# Patient Record
Sex: Female | Born: 1980 | Race: White | Hispanic: No | Marital: Married | Smoking: Former smoker
Health system: Southern US, Community
[De-identification: ages and names within clinical notes are randomized; demographics above are authoritative.]

## PROBLEM LIST (undated history)

## (undated) DIAGNOSIS — T7840XA Allergy, unspecified, initial encounter: Secondary | ICD-10-CM

## (undated) HISTORY — DX: Allergy, unspecified, initial encounter: T78.40XA

---

## 2004-02-27 ENCOUNTER — Emergency Department (HOSPITAL_COMMUNITY): Admission: EM | Admit: 2004-02-27 | Discharge: 2004-02-27 | Payer: Self-pay | Admitting: Emergency Medicine

## 2006-06-10 HISTORY — PX: CHOLECYSTECTOMY: SHX55

## 2008-02-19 ENCOUNTER — Emergency Department (HOSPITAL_COMMUNITY): Admission: EM | Admit: 2008-02-19 | Discharge: 2008-02-20 | Payer: Self-pay | Admitting: Emergency Medicine

## 2008-06-09 HISTORY — PX: LAPAROSCOPIC OVARIAN CYSTECTOMY: SUR786

## 2009-01-09 HISTORY — PX: PILONIDAL CYST EXCISION: SHX744

## 2010-01-29 IMAGING — CT CT PELVIS W/ CM
2 of 5 series · 17 of 46 positions shown, 19 images · IV contrast (agent unspecified)
Comparison: None

CT ABDOMEN

CLINICAL DATA: Right lower quadrant pain.

CT ABDOMEN AND PELVIS WITH CONTRAST
TECHNIQUE: Multidetector CT imaging of the abdomen and pelvis was
performed using the standard protocol following bolus
administration of intravenous contrast.
Contrast: 100 ml [id number]

[Series 2: abd_pel 5.0 b40f st · axial · 0.64mm/px · z∈[+990,+1350]mm · 14 of 80 slices shown, 16 images]
[im 4/80  soft-tissue]
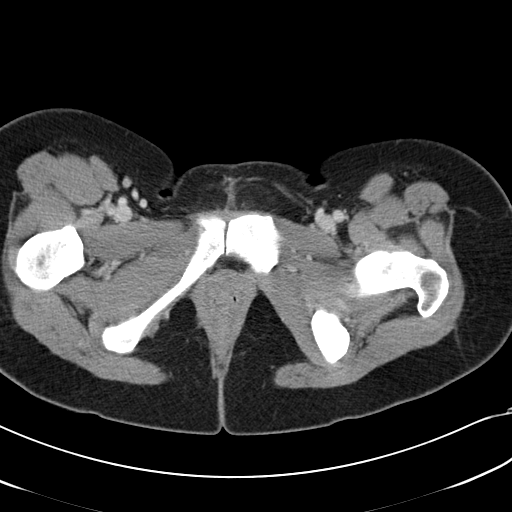
[im 4/80  bone]
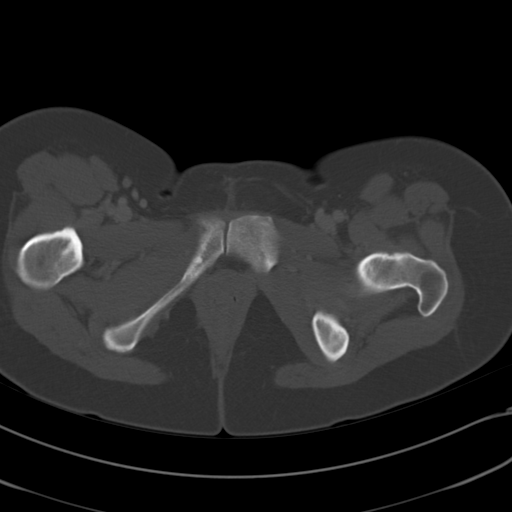
[im 12/80  soft-tissue]
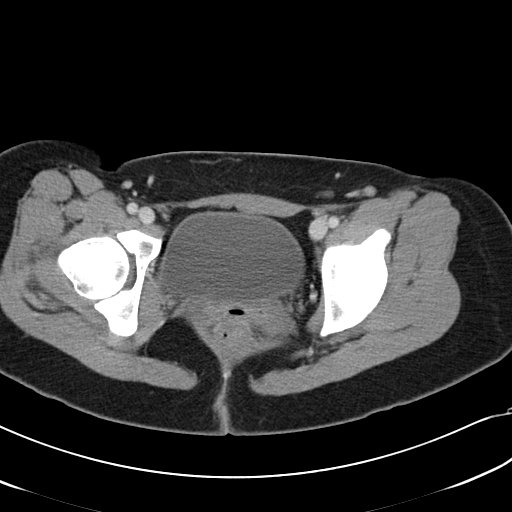
[im 16/80  soft-tissue]
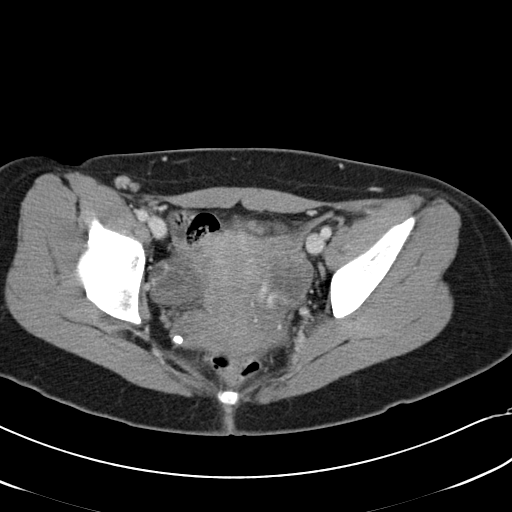
[im 20/80  soft-tissue]
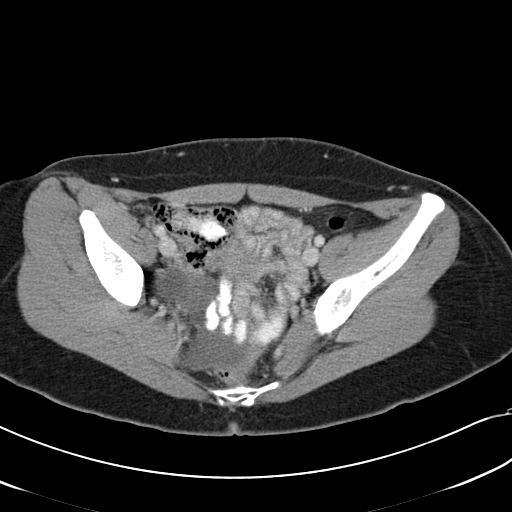
[im 28/80  soft-tissue]
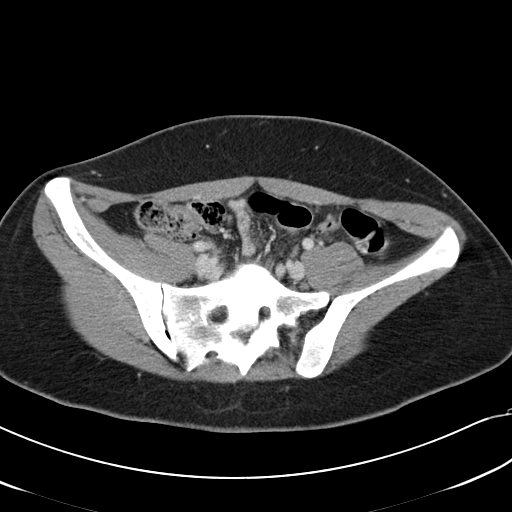
[im 32/80  soft-tissue]
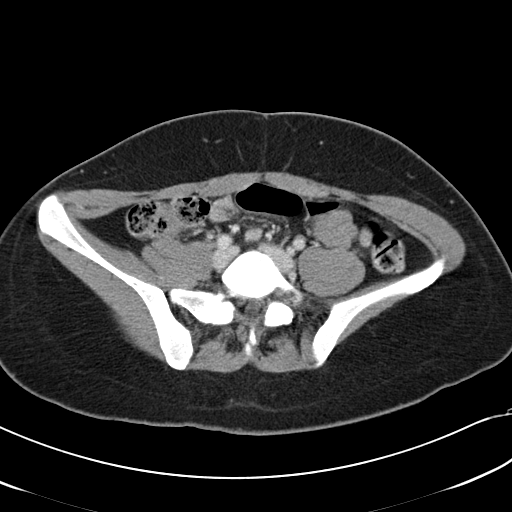
[im 36/80  soft-tissue]
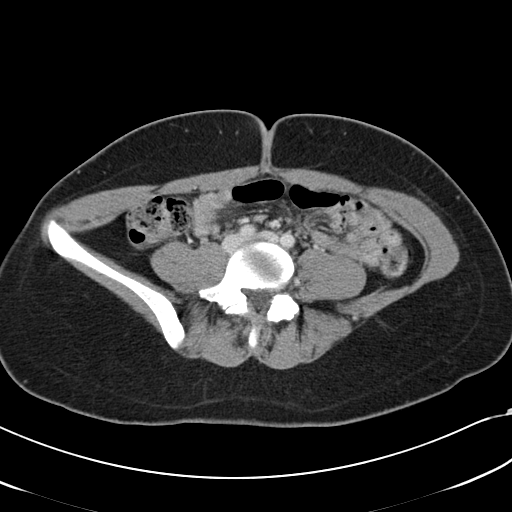
[im 44/80  soft-tissue]
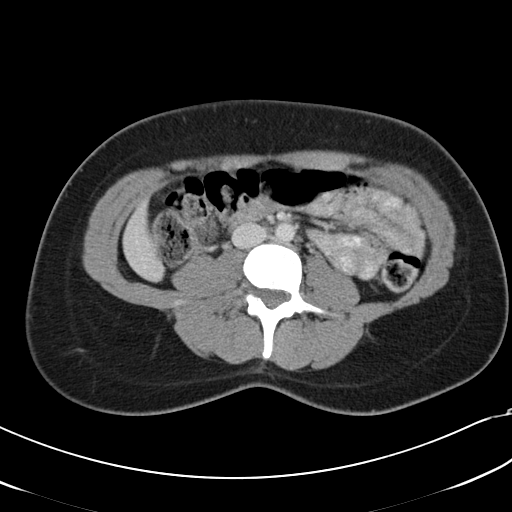
[im 48/80  soft-tissue]
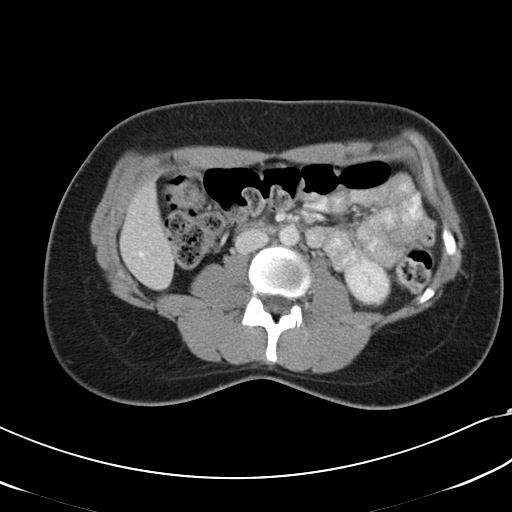
[im 48/80  bone]
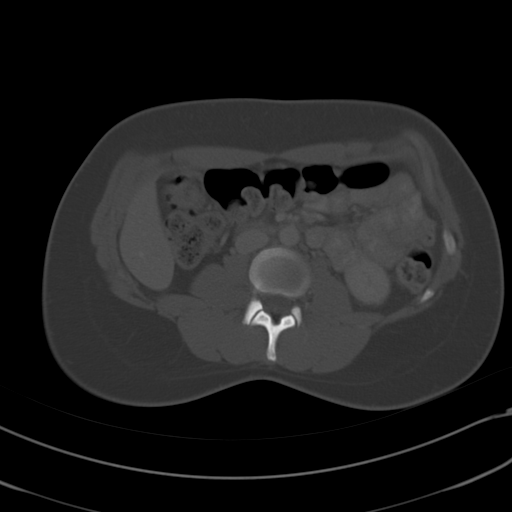
[im 52/80  soft-tissue]
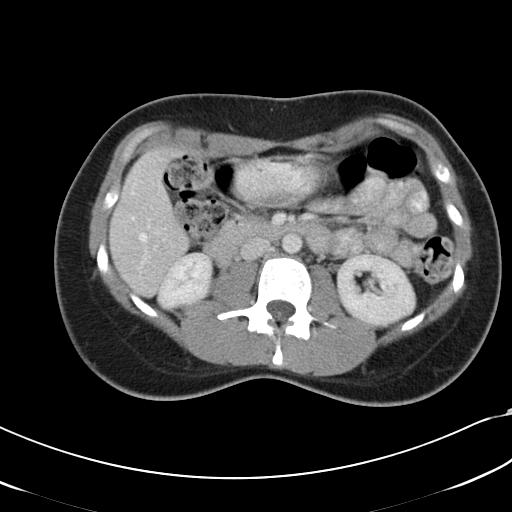
[im 60/80  soft-tissue]
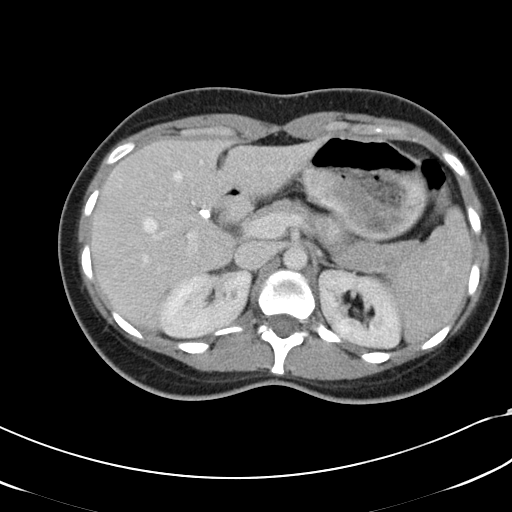
[im 64/80  soft-tissue]
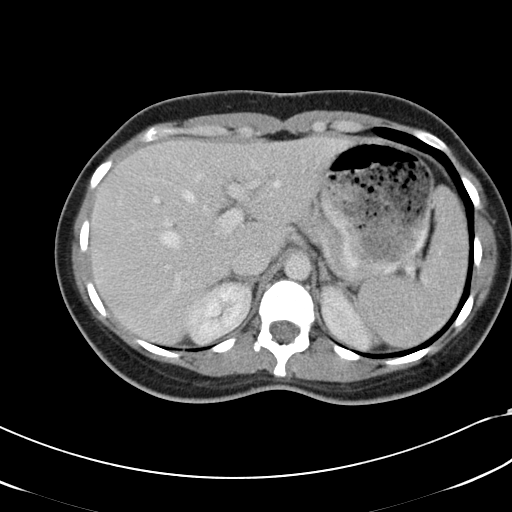
[im 68/80  soft-tissue]
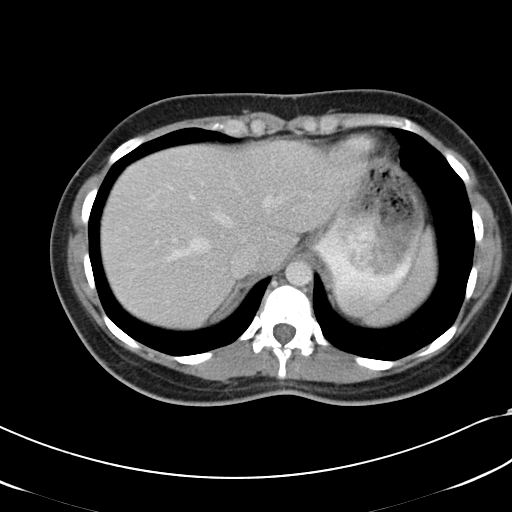
[im 76/80  soft-tissue]
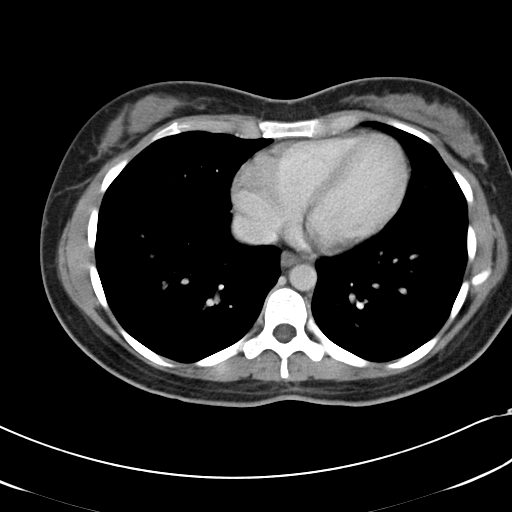

[Series 602: coronal · coronal · 0.81mm/px · 3 of 61 slices shown]
[im 21/61  soft-tissue]
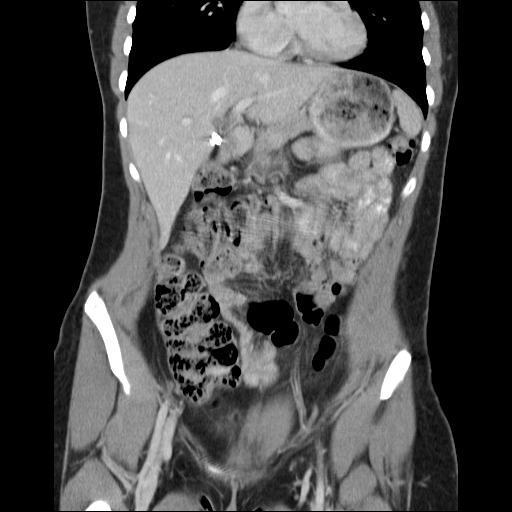
[im 27/61  soft-tissue]
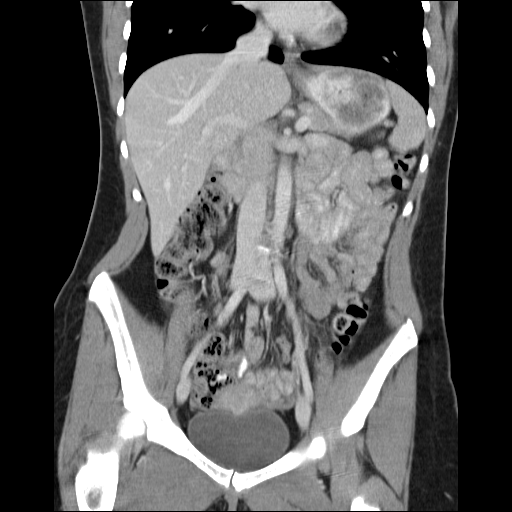
[im 34/61  soft-tissue]
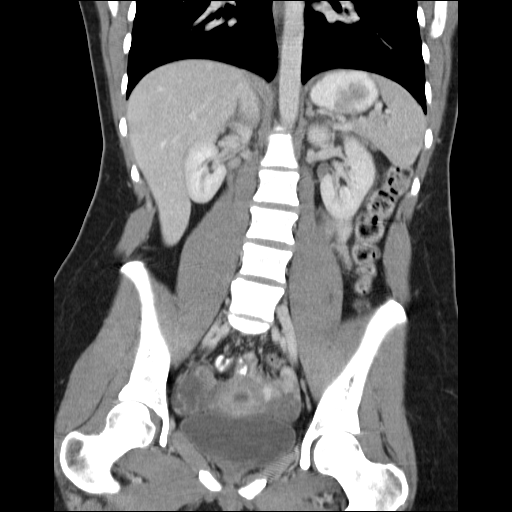

[17 of 46 positions shown; findings below may reference images not displayed]

FINDINGS: The patient is status post cholecystectomy.  Liver,
spleen, pancreas, adrenals, kidneys unremarkable. Bowel grossly
unremarkable.  No free fluid, free air, or adenopathy. Aorta is
normal caliber

Lung bases are clear.  No effusions.  Heart is normal size.
IMPRESSION: No acute findings in the abdomen.

CT PELVIS
FINDINGS: Appendix is visualized and is normal.  There are
bilateral ovarian cystic masses, right worse than left.  Small
amount of free fluid in the pelvis.  In the right ovary, there is
internal soft tissue within the cystic lesion.  Recommend
correlation with quantitative beta HCG to completely exclude
ectopic pregnancy.  Alternatively, this could represent a tubo-
ovarian abscess or a complex hemorrhagic cyst.  Uterus
unremarkable.  Small bowel unremarkable.

No acute bony abnormality.
IMPRESSION: Complex cystic areas in the ovaries bilaterally, right worse than
left.  Within the right ovarian cystic mass, soft tissue is
present.  Recommend correlation with quantitative beta HCG to
exclude ectopic pregnancy.  This could also represent tubo-ovarian
abscess or hemorrhagic cyst.

Small amount of free fluid in the pelvis.

Appendix normal.

These results were discussed with Dr. Sogso at the time of
interpretation.

## 2010-05-26 LAB — URINALYSIS, ROUTINE W REFLEX MICROSCOPIC
Bilirubin Urine: NEGATIVE
Glucose, UA: NEGATIVE mg/dL
Ketones, ur: NEGATIVE mg/dL
Protein, ur: NEGATIVE mg/dL
pH: 7.5 (ref 5.0–8.0)

## 2010-05-26 LAB — DIFFERENTIAL
Basophils Relative: 0 % (ref 0–1)
Eosinophils Absolute: 0.2 10*3/uL (ref 0.0–0.7)
Eosinophils Relative: 2 % (ref 0–5)
Monocytes Relative: 6 % (ref 3–12)
Neutrophils Relative %: 75 % (ref 43–77)

## 2010-05-26 LAB — CBC
Hemoglobin: 14.2 g/dL (ref 12.0–15.0)
RBC: 4.7 MIL/uL (ref 3.87–5.11)
WBC: 10 10*3/uL (ref 4.0–10.5)

## 2010-05-26 LAB — COMPREHENSIVE METABOLIC PANEL
ALT: 15 U/L (ref 0–35)
AST: 21 U/L (ref 0–37)
Alkaline Phosphatase: 51 U/L (ref 39–117)
CO2: 26 mEq/L (ref 19–32)
GFR calc Af Amer: >60 mL/min (ref >60)
GFR calc non Af Amer: >60 mL/min (ref >60)
Glucose, Bld: 109 mg/dL — ABNORMAL HIGH (ref 70–99)
Potassium: 3.8 mEq/L (ref 3.5–5.1)
Sodium: 137 mEq/L (ref 135–145)

## 2010-05-26 LAB — PREGNANCY, URINE: Preg Test, Ur: NEGATIVE

## 2011-09-29 ENCOUNTER — Ambulatory Visit (INDEPENDENT_AMBULATORY_CARE_PROVIDER_SITE_OTHER): Payer: BC Managed Care – PPO | Admitting: Physician Assistant

## 2011-09-29 VITALS — BP 110/80 | HR 116 | Temp 98.4°F | Resp 16 | Ht 60.5 in | Wt 146.0 lb

## 2011-09-29 DIAGNOSIS — R82998 Other abnormal findings in urine: Secondary | ICD-10-CM

## 2011-09-29 DIAGNOSIS — R110 Nausea: Secondary | ICD-10-CM

## 2011-09-29 DIAGNOSIS — R8271 Bacteriuria: Secondary | ICD-10-CM

## 2011-09-29 DIAGNOSIS — R509 Fever, unspecified: Secondary | ICD-10-CM

## 2011-09-29 LAB — POCT URINALYSIS DIPSTICK
Glucose, UA: NEGATIVE
Protein, UA: 30
Spec Grav, UA: 1.025

## 2011-09-29 LAB — POCT UA - MICROSCOPIC ONLY: Crystals, Ur, HPF, POC: NEGATIVE

## 2011-09-29 LAB — POCT CBC
Granulocyte percent: 64.8 %G (ref 37–80)
MCV: 85.6 fL (ref 80–97)
MID (cbc): 0.4 (ref 0–0.9)
MPV: 7.6 fL (ref 0–99.8)
POC Granulocyte: 3.8 (ref 2–6.9)
POC LYMPH PERCENT: 29.1 %L (ref 10–50)
POC MID %: 6.1 %M (ref 0–12)
Platelet Count, POC: 265 10*3/uL (ref 142–424)
RBC: 5.62 M/uL — AB (ref 4.04–5.48)
RDW, POC: 14.8 %

## 2011-09-29 NOTE — Patient Instructions (Signed)
Get plenty of rest (wink, wink, mom of a toddler) and drink at least 64 ounces of water daily.

## 2011-09-29 NOTE — Progress Notes (Signed)
Subjective:    Patient ID: Sharon Graves, female    DOB: 11/08/1980, 31 y.o.   MRN: [id number]  HPIThis 31 y.o. female presents for evaluation of fever since 09/27/2011.  Awoke with low back pain (typical for her with fever) and fever of 100.  Relieved temporarily with acetaminophen.  Has developed some GI upset with nausea.  No diarrhea "yet."  Wasn't really feeling bad until today.  Poor appetite.  No known tick exposure. No urinary symptoms, no URI-type symptoms.  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    Review of Systems About 10 days ago developed a tender lump behind her ear after wearing earrings for prolonged period.  Is steadily resolving.  Had a brief episode of heart palpitations last week, prior to the onset of her current symptoms.  No associated chest pain, SOB, dizziness.    History reviewed. No pertinent past medical history.  Past Surgical History  Procedure Date  . Laparoscopic ovarian cystectomy 06/2008    right  . Pilonidal cyst excision 2010  . Cholecystectomy 06/2006    Prior to Admission medications   Not on File    Allergies  Allergen Reactions  . Biaxin (Clarithromycin) Shortness Of Breath  . Ciprofloxacin     Unknown reaction  . Demerol (Meperidine) Nausea Only  . Ibuprofen     Gi bleed   . Toradol (Ketorolac Tromethamine) Itching    History   Social History  . Marital Status: Married    Spouse Name: Yeira Gulden    Number of Children: 1  . Years of Education: 15   Occupational History  . Infusion Nurse (RN)     [employer]   Social History Main Topics  . Smoking status: Former Smoker -- 1.0 packs/day for 8 years    Quit date: 11/03/2008  . Smokeless tobacco: Never Used  . Alcohol Use: No  . Drug Use: No  . Sexually Active: Yes -- Female partner(s)    Birth Control/ Protection: None     "Let nature take it's course."   Other Topics Concern  . Not on file   Social  History Narrative   Interested in having 3 more children.  Both sister's have had miscarriages, so she's suspecting she may have trouble in the future.    Family History  Problem Relation Age of Onset  . Hypertension Mother   . Heart disease Father 5    continues to smoke  . COPD Father   . Ovarian cysts Sister   . Ovarian cysts Sister        Objective:   Physical Exam  Vitals reviewed. Constitutional: She is oriented to person, place, and time. She appears well-developed and well-nourished. No distress.  HENT:  Head: Normocephalic and atraumatic.  Right Ear: Hearing, tympanic membrane, external ear and ear canal normal.  Left Ear: Hearing, tympanic membrane, external ear and ear canal normal.  Nose: Nose normal.  Mouth/Throat: Uvula is midline, oropharynx is clear and moist and mucous membranes are normal. No oral lesions. No uvula swelling.  Eyes: Conjunctivae, EOM and lids are normal. Pupils are equal, round, and reactive to light. Right eye exhibits no discharge. Left eye exhibits no discharge. No scleral icterus.  Neck: Normal range of motion, full passive range of motion without pain and phonation normal. Neck supple. No spinous process tenderness and no muscular tenderness present. No mass and no thyromegaly present.  Cardiovascular: Regular rhythm, normal heart  sounds and intact distal pulses.  Tachycardia present.   Pulmonary/Chest: Effort normal and breath sounds normal.  Abdominal: Soft. Bowel sounds are normal. She exhibits no mass. There is no hepatosplenomegaly. There is tenderness (epigastrum, suprapubic, bilateral lower quadrants. Palpation caused increased nausea and gagging.) in the right lower quadrant, epigastric area, suprapubic area and left lower quadrant. There is no rigidity, no rebound, no guarding, no CVA tenderness, no tenderness at McBurney's point and negative Murphy's sign. No hernia.  Musculoskeletal: She exhibits no edema and no tenderness.        Cervical back: Normal.       Thoracic back: Normal.       Lumbar back: Normal.  Lymphadenopathy:    She has no cervical adenopathy.       Right: No inguinal, no supraclavicular and no epitrochlear adenopathy present.       Left: No inguinal, no supraclavicular and no epitrochlear adenopathy present.  Neurological: She is alert and oriented to person, place, and time. She has normal strength. No cranial nerve deficit or sensory deficit.  Skin: Skin is warm, dry and intact. No rash noted. No cyanosis. Nails show no clubbing.  Psychiatric: She has a normal mood and affect. Her speech is normal and behavior is normal. Judgment and thought content normal. Cognition and memory are normal.   Results for orders placed in visit on 09/29/11  POCT CBC      Component Value Range   WBC 5.9  4.6 - 10.2 K/uL   Lymph, poc 1.7  0.6 - 3.4   POC LYMPH PERCENT 29.1  10 - 50 %L   MID (cbc) 0.4  0 - 0.9   POC MID % 6.1  0 - 12 %M   POC Granulocyte 3.8  2 - 6.9   Granulocyte percent 64.8  37 - 80 %G   RBC 5.62 (*) 4.04 - 5.48 M/uL   Hemoglobin 15.4  12.2 - 16.2 g/dL   HCT, POC 16.1 (*) 09.6 - 47.9 %   MCV 85.6  80 - 97 fL   MCH, POC 27.4  27 - 31.2 pg   MCHC 32.0  31.8 - 35.4 g/dL   RDW, POC 04.5     Platelet Count, POC 265  142 - 424 K/uL   MPV 7.6  0 - 99.8 fL  POCT UA - MICROSCOPIC ONLY      Component Value Range   WBC, Ur, HPF, POC 18-28     RBC, urine, microscopic 2-3     Bacteria, U Microscopic 2+     Mucus, UA positive     Epithelial cells, urine per micros 6-10     Crystals, Ur, HPF, POC negative     Casts, Ur, LPF, POC negative     Yeast, UA negative    POCT URINALYSIS DIPSTICK      Component Value Range   Color, UA dark yellow     Clarity, UA cloudy     Glucose, UA negative     Bilirubin, UA small     Ketones, UA trace     Spec Grav, UA 1.025     Blood, UA trace-lysed     pH, UA 5.5     Protein, UA 30     Urobilinogen, UA 1.0     Nitrite, UA negative     Leukocytes, UA small  (1+)        Assessment & Plan:   1. Fever  POCT CBC, POCT UA - Microscopic Only,  POCT urinalysis dipstick, Rocky mtn spotted fvr ab, IgM-blood  2. Nausea  POCT UA - Microscopic Only, POCT urinalysis dipstick  3. Bacteriuria with pyuria  Urine culture   Supportive care for now.  Await lab results.  Patient will call if she develops new symptoms that may guide our thinking.

## 2011-10-01 ENCOUNTER — Telehealth: Payer: Self-pay | Admitting: Radiology

## 2011-10-01 LAB — ROCKY MTN SPOTTED FVR AB, IGM-BLOOD: ROCKY MTN SPOTTED FEVER, IGM: 1.54 IV

## 2011-10-01 LAB — URINE CULTURE: Colony Count: >=100000

## 2011-10-01 NOTE — Telephone Encounter (Signed)
Yes with the data that I have available to me right now she can stop her antibiotic.

## 2011-10-01 NOTE — Telephone Encounter (Signed)
    Notes Recorded by Fernande Bras, PA-C on 10/01/2011 at 1:19 PM Please call patient. UCx reveals a UTI, which is the likely cause of her symptoms. I advise treatment with Septra DS 1 PO BID x 5 days. I haven't received the RMSF result yet, but will let her know when I do.      Component  Value    Colony Count  >=100,000 COLONIES/ML    Organism ID, Bacteria  Multiple bacterial morphotypes present, none    Organism ID, Bacteria  predominant. Suggest appropriate recollection if     Organism ID, Bacteria  clinically indicated.    Resulting Agency     Patient called back, she indicates she asked lab tech what bacteria her urine grew out, and did not get an answer. I advised her it grew multiple, but not one specific bacteria. She is much better, states she was very dehydrated when she was seen and she has no symptoms of UTI, is it okay for her not to take the ABX< she does not want to take if she does not need it. Please advise and I will call her back on her cell [phone].

## 2011-10-02 NOTE — Telephone Encounter (Signed)
I have called patient to advise.  Left message for her  

## 2011-10-02 NOTE — Telephone Encounter (Signed)
She gives cell #[phone] but this number disconnected I called her husband and number is [phone], she is advised it is okay for her to hold on the ABX and we will call her when her RMSF titer comes in.

## 2011-10-03 ENCOUNTER — Telehealth: Payer: Self-pay

## 2011-10-03 ENCOUNTER — Other Ambulatory Visit: Payer: Self-pay | Admitting: Physician Assistant

## 2011-10-03 DIAGNOSIS — A770 Spotted fever due to Rickettsia rickettsii: Secondary | ICD-10-CM

## 2011-10-03 MED ORDER — DOXYCYCLINE HYCLATE 100 MG PO CAPS: 100.0000 mg | ORAL_CAPSULE | Freq: Two times a day (BID) | ORAL | Status: AC

## 2011-10-03 MED ORDER — DOXYCYCLINE HYCLATE 100 MG PO CAPS: 100.0000 mg | ORAL_CAPSULE | Freq: Two times a day (BID) | ORAL | Status: DC

## 2011-10-03 NOTE — Telephone Encounter (Signed)
Changed pharmacy and resent Doxy. Called pt LMOM to let pt know.

## 2011-10-03 NOTE — Telephone Encounter (Signed)
Pt called to change pharmacy for her doxycycline rx. States when she was in office it was sent to [facility] long outpatient but they are not open today and she needs to get started on rx b/c she said dr thinks she might have rocky mountain spotted fever.   Asks [place] to call in to [facility] in [place] on [address]  Best pt: [phone]  bf

## 2011-10-22 ENCOUNTER — Ambulatory Visit: Payer: BC Managed Care – PPO

## 2011-10-22 ENCOUNTER — Ambulatory Visit (INDEPENDENT_AMBULATORY_CARE_PROVIDER_SITE_OTHER): Payer: BC Managed Care – PPO | Admitting: Physician Assistant

## 2011-10-22 VITALS — BP 130/72 | HR 81 | Temp 98.2°F | Resp 18 | Ht 60.0 in | Wt 146.0 lb

## 2011-10-22 DIAGNOSIS — R05 Cough: Secondary | ICD-10-CM

## 2011-10-22 DIAGNOSIS — R059 Cough, unspecified: Secondary | ICD-10-CM

## 2011-10-22 DIAGNOSIS — R894 Abnormal immunological findings in specimens from other organs, systems and tissues: Secondary | ICD-10-CM

## 2011-10-22 DIAGNOSIS — R768 Other specified abnormal immunological findings in serum: Secondary | ICD-10-CM

## 2011-10-22 MED ORDER — CEFDINIR 300 MG PO CAPS: 600.0000 mg | ORAL_CAPSULE | Freq: Every day | ORAL | Status: AC

## 2011-10-22 NOTE — Progress Notes (Signed)
  Subjective:    Patient ID: Sharon Graves, female    DOB: January 27, 1981, 31 y.o.   MRN: [id number]  HPI  This 31 y.o. female presents for evaluation of cough x 3 weeks.  She also needs to have a repeat RMSF titer drawn due to an initial elevated level 4 weeks ago.  She completed a course of Doxycycline. She's feeling tired and achey and muscle fatigue. Muscle pains are migratory, and feel like strong cramps or "like when you have the flu. "Cough is "hacky" and sometimes productive.  No fevers, chills. No GU/GI symptoms. No nasal congestion.  Has some sore throat a few days ago, but now resolved.  No post-nasal drainage.  No chest pain or shortness of breath, but she's easily fatigued.   Review of Systems As above.    Objective:   Physical Exam Blood pressure 130/72, pulse 81, temperature 98.2 F (36.8 C), temperature source Oral, resp. rate 18, height 5' (1.524 m), weight 146 lb (66.225 kg), last menstrual period 09/22/2011. Body mass index is 28.51 kg/(m^2). Well-developed, well nourished WF who is awake, alert and oriented, in NAD. HEENT: [place]/AT, sclera and conjunctiva are clear.  EAC are patent, TMs are normal in appearance. Nasal mucosa is pink and moist. OP is clear. Neck: supple, non-tender, no lymphadenopathy, thyromegaly. Heart: RRR, no murmur Lungs: normal effort, CTA Extremities: no cyanosis, clubbing or edema. Skin: warm and dry without rash.   CXR: [facility] reading (PRIMARY) by  Dr. Alwyn Ren.  Normal chest.      Assessment & Plan:   1. Autoantibody titer elevated  Rocky mtn spotted fvr ab, IgM-blood  2. Cough  DG Chest 2 View, cefdinir (OMNICEF) 300 MG capsule

## 2011-10-22 NOTE — Patient Instructions (Addendum)
I will contact you with your lab results as soon as they are available.  If you have not heard from me in 1 week, please contact me.

## 2014-03-12 ENCOUNTER — Ambulatory Visit (INDEPENDENT_AMBULATORY_CARE_PROVIDER_SITE_OTHER): Payer: PRIVATE HEALTH INSURANCE | Admitting: Podiatry

## 2014-03-12 ENCOUNTER — Ambulatory Visit (INDEPENDENT_AMBULATORY_CARE_PROVIDER_SITE_OTHER): Payer: PRIVATE HEALTH INSURANCE

## 2014-03-12 ENCOUNTER — Encounter: Payer: Self-pay | Admitting: Podiatry

## 2014-03-12 VITALS — BP 125/89 | HR 62 | Resp 12

## 2014-03-12 DIAGNOSIS — R52 Pain, unspecified: Secondary | ICD-10-CM | POA: Diagnosis not present

## 2014-03-12 DIAGNOSIS — M722 Plantar fascial fibromatosis: Secondary | ICD-10-CM

## 2014-03-12 MED ORDER — METHYLPREDNISOLONE (PAK) 4 MG PO TABS: ORAL_TABLET | ORAL | Status: DC

## 2014-03-12 NOTE — Progress Notes (Signed)
   Subjective:    Patient ID: Sharon Graves, female    DOB: 08/27/1980, 34 y.o.   MRN: [id number]  HPI PT STATED B/L BOTTOM, SIDE, AND BACK OF THE HEEL VERY PAINFUL AND CRAMPING ALL THE WAY BACK OF THE CALF FOR 2 YEARS. FEET ARE GETTING WORSE AND GET AGGRAVATED BY WALKING. TRIED INSERTS BUT NO HELP.   Review of Systems  Allergic/Immunologic: Positive for food allergies.  All other systems reviewed and are negative.      Objective:   Physical Exam: I have reviewed her past history medications allergies surgery social history and review of systems. Pulses are strongly palpable bilateral. Neurologic sensorium is intact per Semmes-Weinstein monofilament. Deep tendon reflexes are intact bilateral and muscle strength is 5 over 5 dorsiflexion plantar flexors and inverters and everters all intrinsic musculature is intact. Orthopedic evaluation demonstrates pain on palpation medial calcaneal tubercles of the bilateral heels she also has pain on palpation the lateral aspect of the heels the forefoot dorsum of the foot as well as the posterior tendo Achilles region. Radiographic evaluation demonstrates very small plantar distally oriented calcaneal heel spurs with a soft tissue increase in density at the plantar fascial calcaneal insertion site. Cutaneous evaluation demonstrates supple well-hydrated cutis no erythema edema cellulitis drainage or odor.        Assessment & Plan:  Assessment: Chronic intractable plantar fasciitis with lateral compensatory syndrome bilaterally.  Plan: We discussed the etiology pathology conservative versus surgical therapy. We discussed appropriate shoe gear stretching exercises ice therapy and shoe gear modifications. I offered her cortisone injection bilaterally which she declined. At this point we started her on Medrol and wrote prescription for meloxicam. We also dispensed plan a fascial braces and the night splint. She was also scanned for set of orthotics. I will  follow up with her once have arrived.

## 2014-04-09 ENCOUNTER — Encounter: Payer: Self-pay | Admitting: Podiatry

## 2014-04-09 ENCOUNTER — Ambulatory Visit (INDEPENDENT_AMBULATORY_CARE_PROVIDER_SITE_OTHER): Payer: PRIVATE HEALTH INSURANCE | Admitting: Podiatry

## 2014-04-09 VITALS — BP 108/74 | HR 95 | Resp 16

## 2014-04-09 DIAGNOSIS — M722 Plantar fascial fibromatosis: Secondary | ICD-10-CM

## 2014-04-09 NOTE — Progress Notes (Signed)
She presents today for a follow-up of bilateral plantar fasciitis. She states that "as long as I do when supposed to do they feel great". She states that over the weekend she was not as good as she should've vanish as a little pain today.  Objective: Vital signs are stable she is alert and oriented 3 mild tenderness on palpation medial calcaneal tubercles bilateral. No erythema edema cellulitis drainage or odor.  Assessment: Well-healing plantar fasciitis bilateral.  Plan: Dispensed orthotics today and we'll continue all conservative therapies follow up with me in 1 month if necessary.

## 2014-05-14 ENCOUNTER — Ambulatory Visit: Payer: PRIVATE HEALTH INSURANCE | Admitting: Podiatry

## 2014-05-21 ENCOUNTER — Ambulatory Visit (INDEPENDENT_AMBULATORY_CARE_PROVIDER_SITE_OTHER): Payer: PRIVATE HEALTH INSURANCE | Admitting: Emergency Medicine

## 2014-05-21 VITALS — BP 110/70 | HR 103 | Temp 98.1°F | Resp 16 | Ht 60.0 in | Wt 147.0 lb

## 2014-05-21 DIAGNOSIS — R1011 Right upper quadrant pain: Secondary | ICD-10-CM

## 2014-05-21 DIAGNOSIS — R1114 Bilious vomiting: Secondary | ICD-10-CM

## 2014-05-21 LAB — POCT URINALYSIS DIPSTICK
BILIRUBIN UA: NEGATIVE
Blood, UA: NEGATIVE
GLUCOSE UA: NEGATIVE
KETONES UA: NEGATIVE
Nitrite, UA: NEGATIVE
PROTEIN UA: NEGATIVE
Spec Grav, UA: 1.025
Urobilinogen, UA: 0.2
pH, UA: 8

## 2014-05-21 LAB — POCT CBC
Granulocyte percent: 63.6 %G (ref 37–80)
HCT, POC: 44.6 % (ref 37.7–47.9)
HEMOGLOBIN: 14.9 g/dL (ref 12.2–16.2)
LYMPH, POC: 1.7 (ref 0.6–3.4)
MCH: 26.9 pg — AB (ref 27–31.2)
MCHC: 33.4 g/dL (ref 31.8–35.4)
MCV: 80.6 fL (ref 80–97)
MID (cbc): 0.4 (ref 0–0.9)
MPV: 6.8 fL (ref 0–99.8)
POC GRANULOCYTE: 3.6 (ref 2–6.9)
POC LYMPH %: 29.9 % (ref 10–50)
POC MID %: 6.5 % (ref 0–12)
Platelet Count, POC: 282 10*3/uL (ref 142–424)
RBC: 5.54 M/uL — AB (ref 4.04–5.48)
RDW, POC: 13.6 %
WBC: 5.6 10*3/uL (ref 4.6–10.2)

## 2014-05-21 LAB — COMPREHENSIVE METABOLIC PANEL
ALK PHOS: 50 U/L (ref 39–117)
ALT: 17 U/L (ref 0–35)
AST: 19 U/L (ref 0–37)
Albumin: 4.8 g/dL (ref 3.5–5.2)
BILIRUBIN TOTAL: 0.7 mg/dL (ref 0.2–1.2)
BUN: 11 mg/dL (ref 6–23)
CO2: 25 mEq/L (ref 19–32)
Calcium: 9.7 mg/dL (ref 8.4–10.5)
Chloride: 104 mEq/L (ref 96–112)
Creat: 0.79 mg/dL (ref 0.50–1.10)
GLUCOSE: 90 mg/dL (ref 70–99)
POTASSIUM: 4.2 meq/L (ref 3.5–5.3)
SODIUM: 139 meq/L (ref 135–145)
TOTAL PROTEIN: 7.4 g/dL (ref 6.0–8.3)

## 2014-05-21 LAB — POCT URINE PREGNANCY: Preg Test, Ur: NEGATIVE

## 2014-05-21 LAB — POCT UA - MICROSCOPIC ONLY
CASTS, UR, LPF, POC: NEGATIVE
CRYSTALS, UR, HPF, POC: NEGATIVE
MUCUS UA: NEGATIVE
YEAST UA: NEGATIVE

## 2014-05-21 LAB — AMYLASE: AMYLASE: 50 U/L (ref 0–105)

## 2014-05-21 LAB — LIPASE: LIPASE: 28 U/L (ref 0–75)

## 2014-05-21 MED ORDER — ONDANSETRON 8 MG PO TBDP: 8.0000 mg | ORAL_TABLET | Freq: Three times a day (TID) | ORAL | Status: AC | PRN

## 2014-05-21 MED ORDER — LANSOPRAZOLE 30 MG PO CPDR: 30.0000 mg | DELAYED_RELEASE_CAPSULE | Freq: Every day | ORAL | Status: AC

## 2014-05-21 NOTE — Addendum Note (Signed)
Addended by: Vira AgarFARRINGTON, Dima Mini L on: 05/21/2014 11:06 AM   Modules accepted: Orders

## 2014-05-21 NOTE — Progress Notes (Signed)
[facility] [address], [place] [id number] [phone]  Date:  05/21/2014   Name:  Sharon Graves   DOB:  April 08, 1980   MRN:  [id number]  PCP:  Danae ChenWanda K Nicholson, MD    Chief Complaint: Abdominal Pain and Nausea   History of Present Illness:  Sharon Graves is a 34 y.o. very pleasant female patient who presents with the following:  Ill since this morning with nausea and RUQ pain. Vomiting and dry heaving repeatedly this morning No frequent heartburn or GERD symptoms No stool change Denies alcohol, tobacco or NSAIDS No hematemesis The patient has no complaint of blood, mucous, or pus in her stools. No food intolerance. No fever or chills No improvement with over the counter medications or other home remedies.  Denies other complaint or health concern today.   There are no active problems to display for this patient.   Past Medical History  Diagnosis Date  . Allergy     Past Surgical History  Procedure Laterality Date  . Laparoscopic ovarian cystectomy  06/2008    right  . Cholecystectomy  06/2006  . Pilonidal cyst excision  01/2009    History  Substance Use Topics  . Smoking status: Former Smoker -- 1.00 packs/day for 8 years    Quit date: 11/03/2008  . Smokeless tobacco: Never Used  . Alcohol Use: No    Family History  Problem Relation Age of Onset  . Hypertension Mother   . Heart disease Father 2041    continues to smoke  . COPD Father   . Ovarian cysts Sister   . Ovarian cysts Sister     Allergies  Allergen Reactions  . Biaxin [Clarithromycin] Shortness Of Breath  . Shellfish Allergy Anaphylaxis  . Ciprofloxacin     Unknown reaction  . Demerol [Meperidine] Nausea Only  . Ibuprofen     Gi bleed   . Iodine   . Peanut-Containing Drug Products   . Toradol [Ketorolac Tromethamine] Itching    Medication list has been reviewed and updated.  Current Outpatient Prescriptions on File Prior to Visit  Medication Sig  Dispense Refill  . EPINEPHrine 0.3 mg/0.3 mL IJ SOAJ injection Inject 0.3 mg into the skin.     No current facility-administered medications on file prior to visit.    Review of Systems:  As per HPI, otherwise negative.    Physical Examination: Filed Vitals:   05/21/14 0923  BP: 110/70  Pulse: 103  Temp: 98.1 F (36.7 C)  Resp: 16   Filed Vitals:   05/21/14 0923  Height: 5' (1.524 m)  Weight: 147 lb (66.679 kg)   Body mass index is 28.71 kg/(m^2). Ideal Body Weight: Weight in (lb) to have BMI = 25: 127.7  GEN: WDWN, NAD, Non-toxic, A & O x 3 HEENT: Atraumatic, Normocephalic. Neck supple. No masses, No LAD. Ears and Nose: No external deformity. CV: RRR, No M/G/R. No JVD. No thrill. No extra heart sounds. PULM: CTA B, no wheezes, crackles, rhonchi. No retractions. No resp. distress. No accessory muscle use. ABD: S, tender RUQ and epigastrium ND, +BS. No rebound. No HSM. EXTR: No c/c/e NEURO Normal gait.  PSYCH: Normally interactive. Conversant. Not depressed or anxious appearing.  Calm demeanor.    Assessment and Plan: Nausea and vomiting Upper abdominal pain Gastritis? Prevacid zofran Clears Follow up tomorrow if not improved   Signed,  Phillips OdorJeffery Arlyn Buerkle, MD  Results for orders placed or performed in visit on 05/21/14  POCT CBC  Result Value Ref Range   WBC 5.6 4.6 - 10.2 K/uL   Lymph, poc 1.7 0.6 - 3.4   POC LYMPH PERCENT 29.9 10 - 50 %L   MID (cbc) 0.4 0 - 0.9   POC MID % 6.5 0 - 12 %M   POC Granulocyte 3.6 2 - 6.9   Granulocyte percent 63.6 37 - 80 %G   RBC 5.54 (A) 4.04 - 5.48 M/uL   Hemoglobin 14.9 12.2 - 16.2 g/dL   HCT, POC 78.2 95.6 - 47.9 %   MCV 80.6 80 - 97 fL   MCH, POC 26.9 (A) 27 - 31.2 pg   MCHC 33.4 31.8 - 35.4 g/dL   RDW, POC 21.3 %   Platelet Count, POC 282 142 - 424 K/uL   MPV 6.8 0 - 99.8 fL  POCT UA - Microscopic Only  Result Value Ref Range   WBC, Ur, HPF, POC 0-4    RBC, urine, microscopic 0-1    Bacteria, U Microscopic  trace    Mucus, UA neg    Epithelial cells, urine per micros 0-5    Crystals, Ur, HPF, POC neg    Casts, Ur, LPF, POC neg    Yeast, UA neg   POCT urinalysis dipstick  Result Value Ref Range   Color, UA yellow    Clarity, UA clear    Glucose, UA neg    Bilirubin, UA neg    Ketones, UA neg    Spec Grav, UA 1.025    Blood, UA neg    pH, UA 8.0    Protein, UA neg    Urobilinogen, UA 0.2    Nitrite, UA neg    Leukocytes, UA Trace   POCT urine pregnancy  Result Value Ref Range   Preg Test, Ur Negative

## 2014-05-21 NOTE — Patient Instructions (Signed)
Clears Clear Liquid Diet A clear liquid diet is a short-term diet that is prescribed to provide the necessary fluid and basic energy you need when you can have nothing else. The clear liquid diet consists of liquids or solids that will become liquid at room temperature. You should be able to see through the liquid. There are many reasons that you may be restricted to clear liquids, such as:  When you have a sudden-onset (acute) condition that occurs before or after surgery.  To help your body slowly get adjusted to food again after a long period when you were unable to have food.  Replacement of fluids when you have a diarrheal disease.  When you are going to have certain exams, such as a colonoscopy, in which instruments are inserted inside your body to look at parts of your digestive system. WHAT CAN I HAVE? A clear liquid diet does not provide all the nutrients you need. It is important to choose a variety of the following items to get as many nutrients as possible:  Vegetable juices that do not have pulp.  Fruit juices and fruit drinks that do not have pulp.  Coffee (regular or decaffeinated), tea, or soda at the discretion of your health care provider.  Clear bouillon, broth, or strained broth-based soups.  High-protein and flavored gelatins.  Sugar or honey.  Ices or frozen ice pops that do not contain milk. If you are not sure whether you can have certain items, you should ask your health care provider. You may also ask your health care provider if there are any other clear liquid options. Document Released: 01/26/2005 Document Revised: 01/31/2013 Document Reviewed: 12/23/2012 [facility] Patient Information 2015 [place] [place]. This information is not intended to replace advice given to you by your health care provider. Make sure you discuss any questions you have with your health care provider.

## 2019-09-07 ENCOUNTER — Telehealth: Payer: Self-pay | Admitting: General Practice

## 2019-09-07 NOTE — Telephone Encounter (Signed)
Patient states she would like to establish care with you . Patient states her mother sees you Lawson Radar.   Please Advise
